# Patient Record
Sex: Female | Born: 1960 | Race: White | Hispanic: No | Marital: Married | Smoking: Current every day smoker
Health system: Southern US, Community
[De-identification: ages and names within clinical notes are randomized; demographics above are authoritative.]

## PROBLEM LIST (undated history)

## (undated) DIAGNOSIS — C801 Malignant (primary) neoplasm, unspecified: Secondary | ICD-10-CM

## (undated) DIAGNOSIS — C50919 Malignant neoplasm of unspecified site of unspecified female breast: Secondary | ICD-10-CM

---

## 2005-01-01 ENCOUNTER — Emergency Department: Payer: Self-pay | Admitting: Emergency Medicine

## 2005-02-24 ENCOUNTER — Emergency Department: Payer: Self-pay | Admitting: Emergency Medicine

## 2005-05-29 ENCOUNTER — Emergency Department: Payer: Self-pay | Admitting: Emergency Medicine

## 2006-03-06 ENCOUNTER — Ambulatory Visit: Payer: Self-pay | Admitting: Family Medicine

## 2007-01-06 ENCOUNTER — Other Ambulatory Visit: Payer: Self-pay

## 2007-01-06 ENCOUNTER — Emergency Department: Payer: Self-pay | Admitting: Emergency Medicine

## 2007-02-03 ENCOUNTER — Emergency Department: Payer: Self-pay | Admitting: Emergency Medicine

## 2007-07-29 ENCOUNTER — Other Ambulatory Visit: Payer: Self-pay

## 2007-07-29 ENCOUNTER — Emergency Department: Payer: Self-pay

## 2007-11-11 DIAGNOSIS — C801 Malignant (primary) neoplasm, unspecified: Secondary | ICD-10-CM

## 2007-11-11 DIAGNOSIS — C50919 Malignant neoplasm of unspecified site of unspecified female breast: Secondary | ICD-10-CM

## 2007-11-11 HISTORY — DX: Malignant (primary) neoplasm, unspecified: C80.1

## 2007-11-11 HISTORY — PX: MASTECTOMY: SHX3

## 2007-11-11 HISTORY — DX: Malignant neoplasm of unspecified site of unspecified female breast: C50.919

## 2007-11-17 ENCOUNTER — Ambulatory Visit: Payer: Self-pay

## 2007-12-09 ENCOUNTER — Ambulatory Visit: Payer: Self-pay | Admitting: General Surgery

## 2007-12-14 ENCOUNTER — Ambulatory Visit: Payer: Self-pay | Admitting: General Surgery

## 2007-12-28 ENCOUNTER — Ambulatory Visit: Payer: Self-pay | Admitting: General Surgery

## 2008-01-04 ENCOUNTER — Ambulatory Visit: Payer: Self-pay | Admitting: Oncology

## 2008-01-09 ENCOUNTER — Ambulatory Visit: Payer: Self-pay | Admitting: Oncology

## 2008-01-11 ENCOUNTER — Ambulatory Visit: Payer: Self-pay | Admitting: Oncology

## 2008-01-20 ENCOUNTER — Ambulatory Visit: Payer: Self-pay | Admitting: General Surgery

## 2008-01-21 ENCOUNTER — Other Ambulatory Visit: Payer: Self-pay

## 2008-02-09 ENCOUNTER — Ambulatory Visit: Payer: Self-pay | Admitting: Oncology

## 2008-03-10 ENCOUNTER — Ambulatory Visit: Payer: Self-pay | Admitting: Oncology

## 2008-03-22 ENCOUNTER — Other Ambulatory Visit: Payer: Self-pay

## 2008-04-10 ENCOUNTER — Ambulatory Visit: Payer: Self-pay | Admitting: Oncology

## 2008-05-10 ENCOUNTER — Ambulatory Visit: Payer: Self-pay | Admitting: Oncology

## 2008-06-10 ENCOUNTER — Ambulatory Visit: Payer: Self-pay | Admitting: Oncology

## 2008-06-19 ENCOUNTER — Other Ambulatory Visit: Payer: Self-pay

## 2008-07-11 ENCOUNTER — Ambulatory Visit: Payer: Self-pay | Admitting: Oncology

## 2008-08-10 ENCOUNTER — Ambulatory Visit: Payer: Self-pay | Admitting: Oncology

## 2008-09-10 ENCOUNTER — Ambulatory Visit: Payer: Self-pay | Admitting: Oncology

## 2008-11-10 ENCOUNTER — Ambulatory Visit: Payer: Self-pay | Admitting: Oncology

## 2008-11-15 ENCOUNTER — Ambulatory Visit: Payer: Self-pay | Admitting: General Surgery

## 2008-11-15 ENCOUNTER — Encounter: Payer: Self-pay | Admitting: Neurology

## 2008-12-04 ENCOUNTER — Ambulatory Visit: Payer: Self-pay | Admitting: Oncology

## 2008-12-11 ENCOUNTER — Ambulatory Visit: Payer: Self-pay | Admitting: Oncology

## 2008-12-11 ENCOUNTER — Encounter: Payer: Self-pay | Admitting: Neurology

## 2009-02-08 ENCOUNTER — Ambulatory Visit: Payer: Self-pay | Admitting: Oncology

## 2009-03-05 ENCOUNTER — Ambulatory Visit: Payer: Self-pay | Admitting: Oncology

## 2009-03-10 ENCOUNTER — Ambulatory Visit: Payer: Self-pay | Admitting: Oncology

## 2009-05-10 ENCOUNTER — Ambulatory Visit: Payer: Self-pay | Admitting: Oncology

## 2009-06-04 ENCOUNTER — Ambulatory Visit: Payer: Self-pay | Admitting: Oncology

## 2009-06-10 ENCOUNTER — Ambulatory Visit: Payer: Self-pay | Admitting: Oncology

## 2009-06-29 IMAGING — US ULTRASOUND RIGHT BREAST
1 series · 16 of 25 positions shown · non-contrast
Comparison: none

REASON FOR EXAM: right breast mass
COMMENTS:

[Series 1: ultrasound right breast · 16 of 43 slices shown]
[im 1/43]
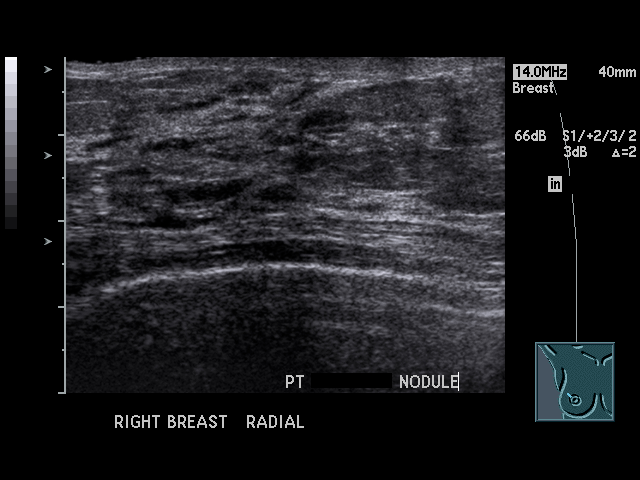
[im 4/43]
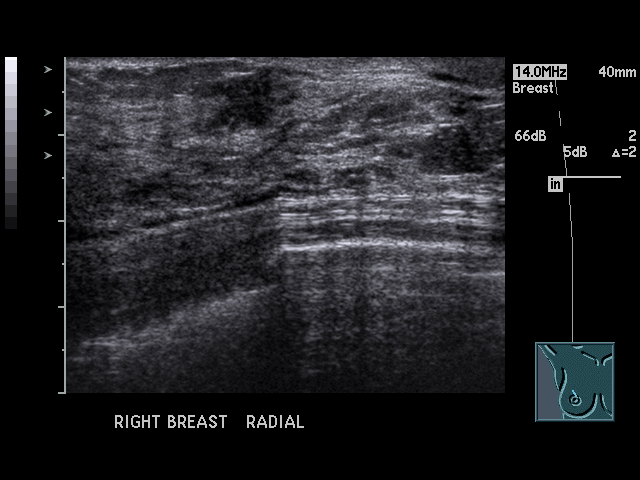
[im 6/43]
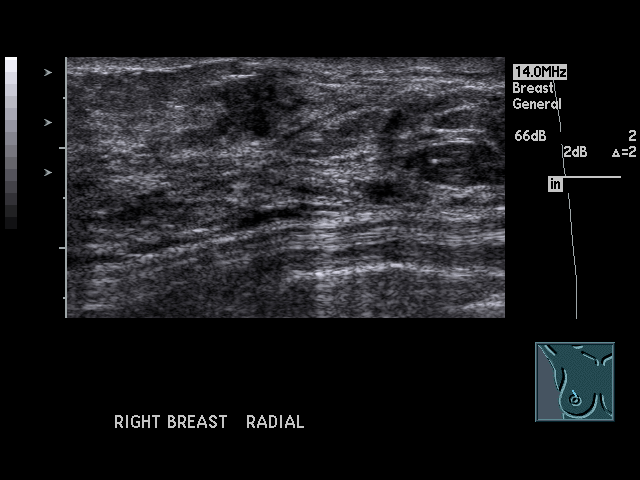
[im 9/43]
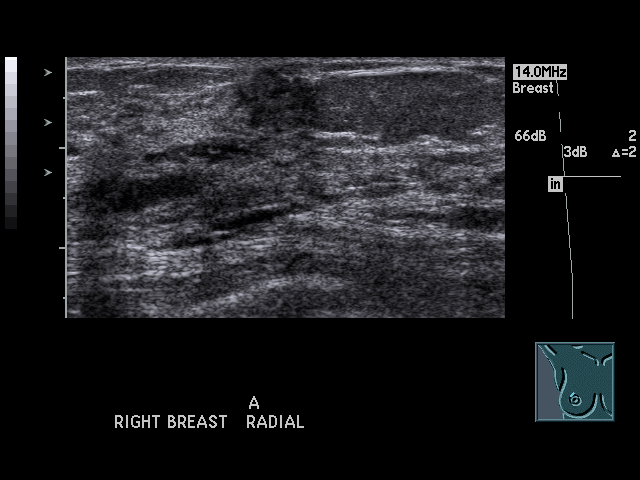
[im 13/43]
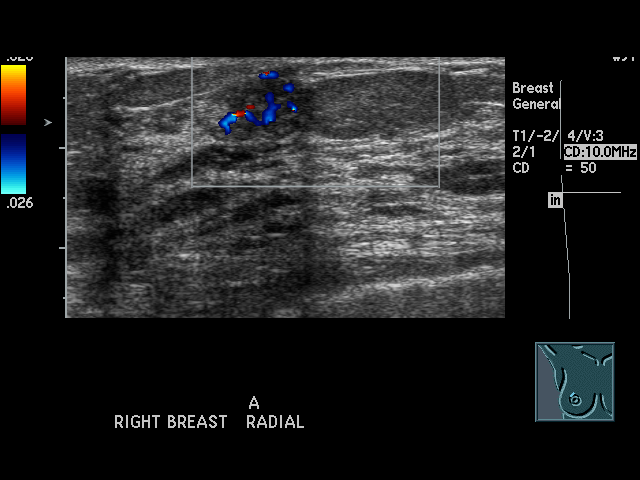
[im 15/43]
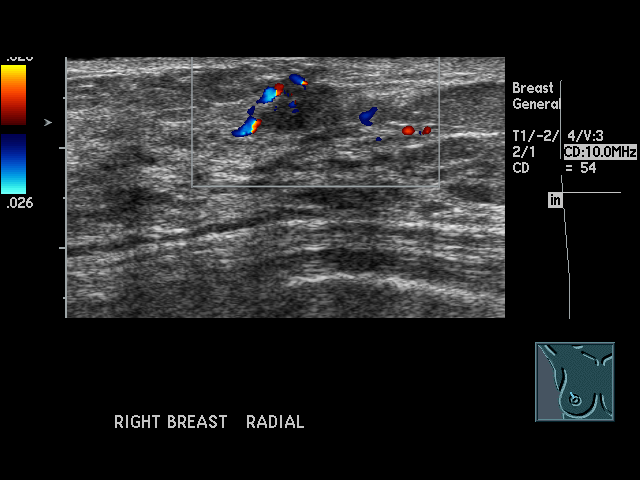
[im 18/43]
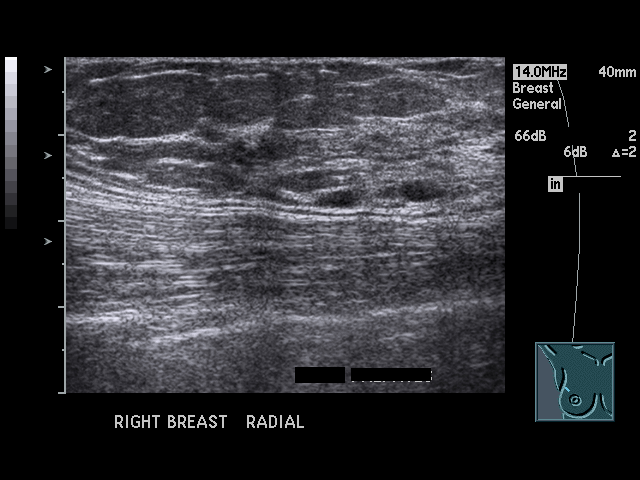
[im 20/43]
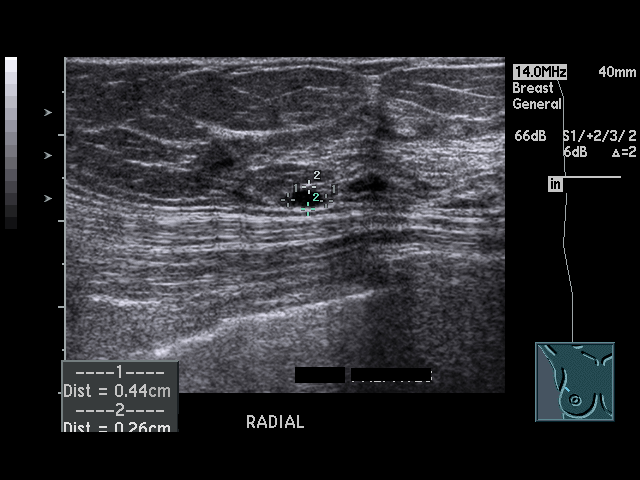
[im 23/43]
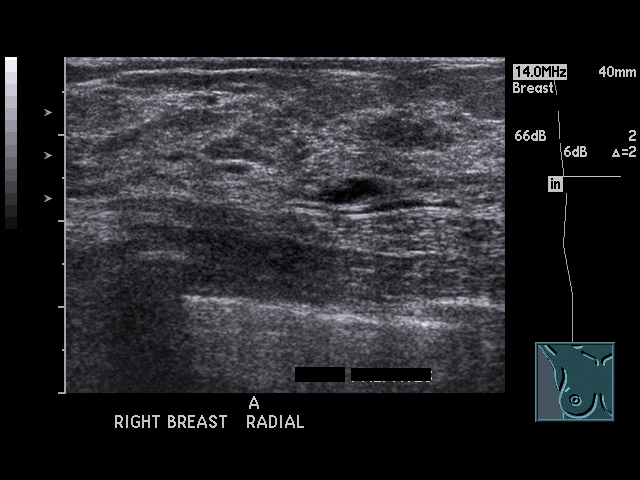
[im 25/43]
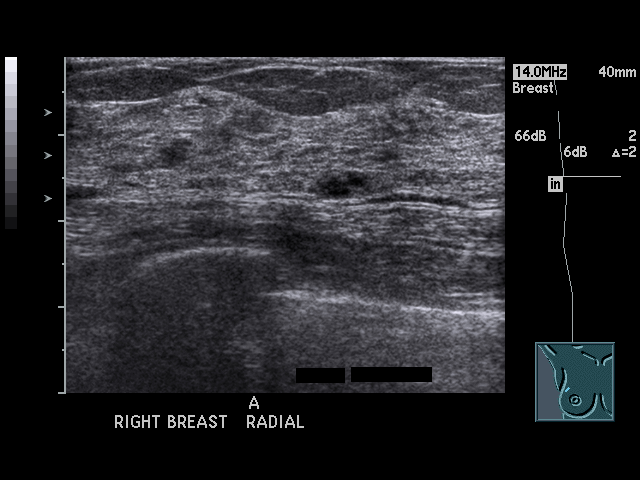
[im 29/43]
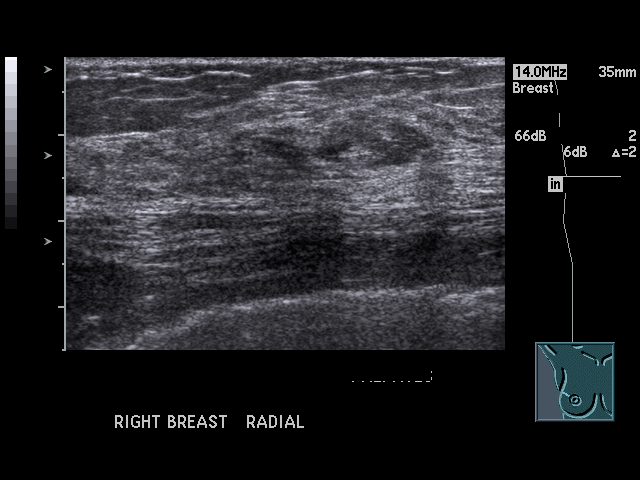
[im 30/43]
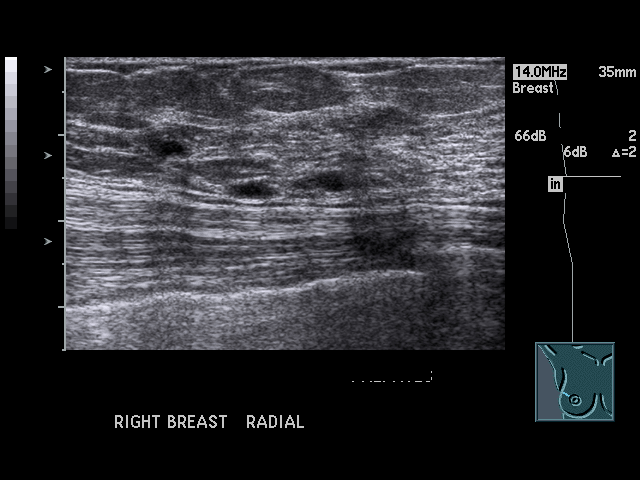
[im 34/43]
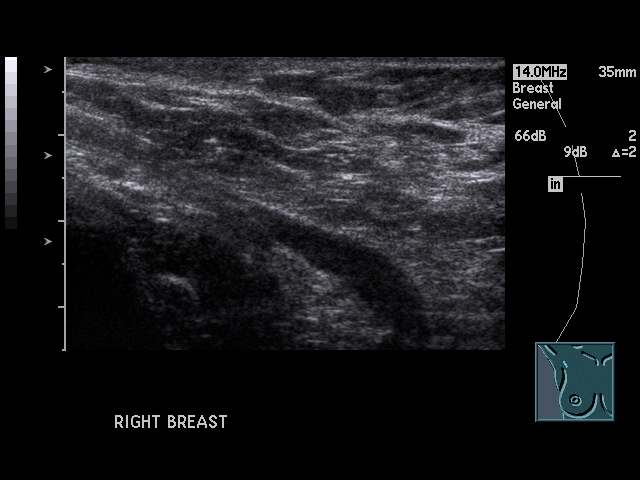
[im 37/43]
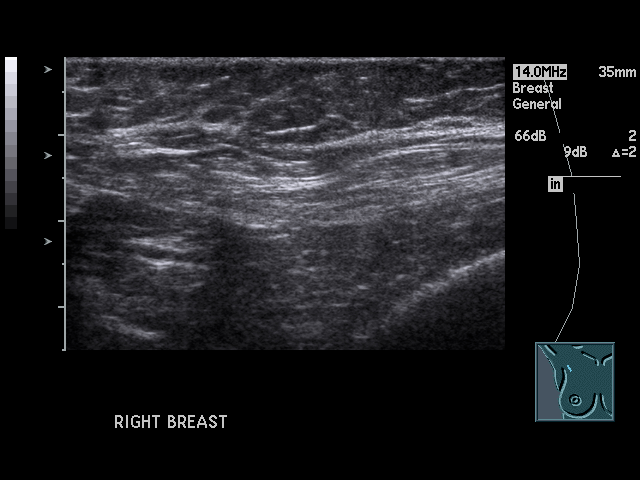
[im 39/43]
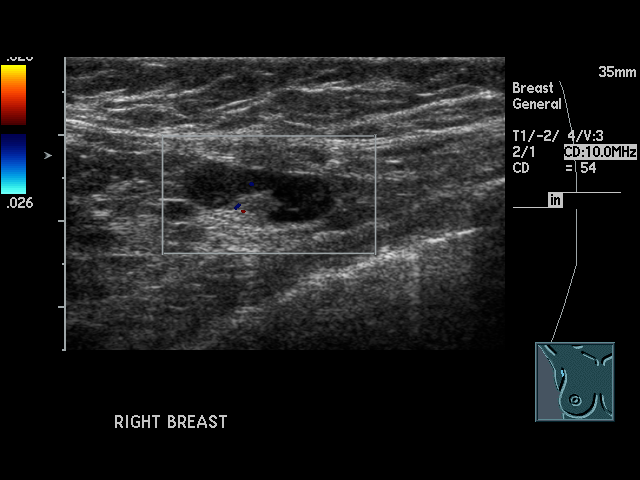
[im 43/43]
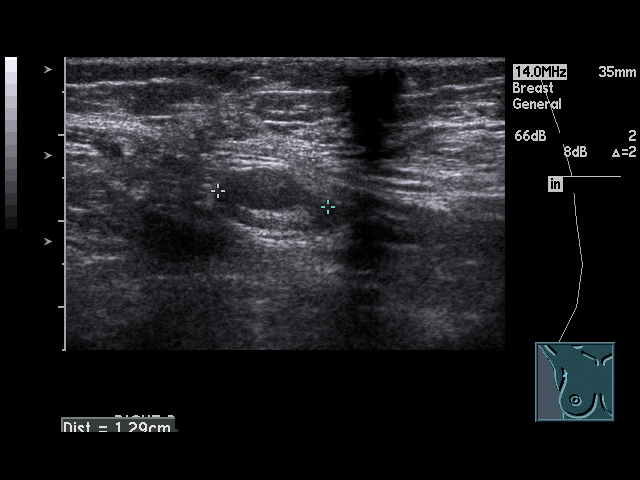

[16 of 25 positions shown; findings below may reference images not displayed]

PROCEDURE:     US  - US RT BREAST ([REDACTED])  - November 17, 2007 [DATE]

RESULT:

Comparison is made to images from [REDACTED] dated 05/01/06.  There
are no prior exams otherwise for comparison.  The patient reports a 10 to 11
o'clock periareolar nodular density. There are also suspicious areas in the
11 o'clock axillary region and the high axilla.  These areas are marked with
triangle outline markers. The breasts exhibit a moderately dense nodular
fibroglandular pattern. No definite persistent mammographic areas of density
are seen in the areas of concern. The parenchymal distribution and relative
density appears to be unchanged compared to the previous outside exams.
Additional magnification compression views over the areas of palpable
abnormality are obtained.  There may be some underlying density in the 11
o'clock axillary region and in the periareolar region but these are poorly
defined. Ultrasound is dictated separately.
IMPRESSION: 1.     BI-RADS:  Category 0-Needs Additional Imaging Evaluation.
2.     Ultrasound correlation of the areas of concern is necessary.
Ultrasound was subsequently performed.  In the 11 o'clock periareolar region
there is a 7.5 x 6.3 x 8.5 mm irregularly marginated solid-appearing
structure with evidence of flow. This is concerning and further
investigation surgically with consideration for biopsy is suggested. The
area in the axilla appears to have a fatty hilum and is likely a lymph node.
The third area of concern in the 11 o'clock axillary region appears to be
small cystic areas but they do not show definite enhancement. These are near
the chest wall.  These are nonspecific.  Given the ultrasound findings, the
exam is BI-RADS:  Category 4- Suspicious Abnormality.  Surgical consultation
with consideration for biopsy is recommended.

## 2009-07-11 ENCOUNTER — Ambulatory Visit: Payer: Self-pay | Admitting: Oncology

## 2009-09-03 ENCOUNTER — Ambulatory Visit: Payer: Self-pay | Admitting: Oncology

## 2009-09-10 ENCOUNTER — Ambulatory Visit: Payer: Self-pay | Admitting: Oncology

## 2009-09-13 ENCOUNTER — Ambulatory Visit: Payer: Self-pay | Admitting: Oncology

## 2009-10-10 ENCOUNTER — Ambulatory Visit: Payer: Self-pay | Admitting: Oncology

## 2009-10-11 ENCOUNTER — Ambulatory Visit: Payer: Self-pay | Admitting: Oncology

## 2009-11-10 ENCOUNTER — Ambulatory Visit: Payer: Self-pay | Admitting: Oncology

## 2009-12-18 ENCOUNTER — Ambulatory Visit: Payer: Self-pay | Admitting: Oncology

## 2010-06-10 ENCOUNTER — Ambulatory Visit: Payer: Self-pay | Admitting: Oncology

## 2010-06-19 ENCOUNTER — Ambulatory Visit: Payer: Self-pay | Admitting: Oncology

## 2010-06-20 LAB — CANCER ANTIGEN 27.29: CA 27.29: 19.6 U/mL (ref 0.0–38.6)

## 2010-07-11 ENCOUNTER — Ambulatory Visit: Payer: Self-pay | Admitting: Oncology

## 2010-12-19 ENCOUNTER — Ambulatory Visit: Payer: Self-pay | Admitting: Oncology

## 2011-03-31 ENCOUNTER — Emergency Department: Payer: Self-pay | Admitting: Emergency Medicine

## 2011-04-21 ENCOUNTER — Ambulatory Visit: Payer: Self-pay | Admitting: Oncology

## 2011-04-22 LAB — CANCER ANTIGEN 27.29: CA 27.29: 17.4 U/mL (ref 0.0–38.6)

## 2011-05-11 ENCOUNTER — Ambulatory Visit: Payer: Self-pay | Admitting: Oncology

## 2011-10-20 ENCOUNTER — Ambulatory Visit: Payer: Self-pay | Admitting: Oncology

## 2011-10-22 ENCOUNTER — Emergency Department: Payer: Self-pay | Admitting: Unknown Physician Specialty

## 2011-11-11 ENCOUNTER — Ambulatory Visit: Payer: Self-pay | Admitting: Oncology

## 2011-12-16 ENCOUNTER — Emergency Department: Payer: Self-pay | Admitting: Emergency Medicine

## 2011-12-16 LAB — CBC
HCT: 39.5 % (ref 35.0–47.0)
MCH: 32.4 pg (ref 26.0–34.0)
MCV: 96 fL (ref 80–100)
Platelet: 218 10*3/uL (ref 150–440)
RBC: 4.12 10*6/uL (ref 3.80–5.20)
RDW: 14.7 % — ABNORMAL HIGH (ref 11.5–14.5)
WBC: 7.6 10*3/uL (ref 3.6–11.0)

## 2011-12-16 LAB — BASIC METABOLIC PANEL
Anion Gap: 13 (ref 7–16)
BUN: 14 mg/dL (ref 7–18)
Calcium, Total: 9 mg/dL (ref 8.5–10.1)
Chloride: 106 mmol/L (ref 98–107)
Co2: 24 mmol/L (ref 21–32)
EGFR (African American): >60
EGFR (Non-African Amer.): >60
Osmolality: 285 (ref 275–301)

## 2012-01-04 ENCOUNTER — Ambulatory Visit: Payer: Self-pay | Admitting: Family Medicine

## 2012-06-07 ENCOUNTER — Ambulatory Visit: Payer: Self-pay | Admitting: Oncology

## 2012-06-09 ENCOUNTER — Ambulatory Visit: Payer: Self-pay | Admitting: Oncology

## 2012-06-10 ENCOUNTER — Ambulatory Visit: Payer: Self-pay | Admitting: Oncology

## 2012-06-10 LAB — CANCER ANTIGEN 27.29: CA 27.29: 10.9 U/mL (ref 0.0–38.6)

## 2012-07-11 ENCOUNTER — Ambulatory Visit: Payer: Self-pay | Admitting: Oncology

## 2012-12-11 ENCOUNTER — Ambulatory Visit: Payer: Self-pay | Admitting: Oncology

## 2013-03-09 ENCOUNTER — Emergency Department: Payer: Self-pay | Admitting: Internal Medicine

## 2013-03-09 LAB — CBC
HCT: 44.2 % (ref 35.0–47.0)
HGB: 15.3 g/dL (ref 12.0–16.0)
MCH: 33.1 pg (ref 26.0–34.0)
MCV: 96 fL (ref 80–100)
Platelet: 191 10*3/uL (ref 150–440)
RDW: 13.8 % (ref 11.5–14.5)
WBC: 5.8 10*3/uL (ref 3.6–11.0)

## 2013-03-09 LAB — COMPREHENSIVE METABOLIC PANEL
Albumin: 4 g/dL (ref 3.4–5.0)
Alkaline Phosphatase: 94 U/L (ref 50–136)
Anion Gap: 6 — ABNORMAL LOW (ref 7–16)
BUN: 15 mg/dL (ref 7–18)
Calcium, Total: 9 mg/dL (ref 8.5–10.1)
Creatinine: 0.75 mg/dL (ref 0.60–1.30)
Osmolality: 280 (ref 275–301)
Potassium: 4.1 mmol/L (ref 3.5–5.1)
SGOT(AST): 20 U/L (ref 15–37)
SGPT (ALT): 16 U/L (ref 12–78)
Total Protein: 6.9 g/dL (ref 6.4–8.2)

## 2013-03-11 ENCOUNTER — Emergency Department: Payer: Self-pay | Admitting: Internal Medicine

## 2013-03-11 LAB — COMPREHENSIVE METABOLIC PANEL
Albumin: 4 g/dL (ref 3.4–5.0)
Anion Gap: 6 — ABNORMAL LOW (ref 7–16)
Calcium, Total: 9.1 mg/dL (ref 8.5–10.1)
Co2: 24 mmol/L (ref 21–32)
Glucose: 150 mg/dL — ABNORMAL HIGH (ref 65–99)
Osmolality: 286 (ref 275–301)
Potassium: 3.8 mmol/L (ref 3.5–5.1)
SGOT(AST): 20 U/L (ref 15–37)
Sodium: 139 mmol/L (ref 136–145)
Total Protein: 6.8 g/dL (ref 6.4–8.2)

## 2013-03-11 LAB — URINALYSIS, COMPLETE
Bilirubin,UR: NEGATIVE
Glucose,UR: NEGATIVE mg/dL (ref 0–75)
Ph: 5 (ref 4.5–8.0)
Protein: NEGATIVE
RBC,UR: 1 /HPF (ref 0–5)
Squamous Epithelial: 6
WBC UR: 1 /HPF (ref 0–5)

## 2013-03-11 LAB — CBC
HGB: 15.5 g/dL (ref 12.0–16.0)
MCHC: 34.6 g/dL (ref 32.0–36.0)
Platelet: 185 10*3/uL (ref 150–440)
WBC: 7.3 10*3/uL (ref 3.6–11.0)

## 2013-03-11 LAB — LIPASE, BLOOD: Lipase: 113 U/L (ref 73–393)

## 2013-03-11 LAB — GC/CHLAMYDIA PROBE AMP

## 2013-04-21 ENCOUNTER — Ambulatory Visit: Payer: Self-pay | Admitting: Oncology

## 2013-04-23 LAB — CANCER ANTIGEN 27.29: CA 27.29: 20.7 U/mL (ref 0.0–38.6)

## 2013-05-10 ENCOUNTER — Ambulatory Visit: Payer: Self-pay | Admitting: Oncology

## 2013-06-10 ENCOUNTER — Ambulatory Visit: Payer: Self-pay | Admitting: Oncology

## 2013-06-13 ENCOUNTER — Ambulatory Visit: Payer: Self-pay | Admitting: Oncology

## 2013-06-20 LAB — CANCER ANTIGEN 27.29: CA 27.29: 11.8 U/mL (ref 0.0–38.6)

## 2013-07-07 ENCOUNTER — Emergency Department: Payer: Self-pay | Admitting: Emergency Medicine

## 2013-07-07 LAB — CBC
HGB: 14.4 g/dL (ref 12.0–16.0)
MCV: 100 fL (ref 80–100)

## 2013-07-07 LAB — BASIC METABOLIC PANEL
Anion Gap: 7 (ref 7–16)
Chloride: 106 mmol/L (ref 98–107)
EGFR (African American): >60
EGFR (Non-African Amer.): >60
Glucose: 107 mg/dL — ABNORMAL HIGH (ref 65–99)
Osmolality: 275 (ref 275–301)
Potassium: 3.3 mmol/L — ABNORMAL LOW (ref 3.5–5.1)
Sodium: 137 mmol/L (ref 136–145)

## 2013-07-07 LAB — TROPONIN I: Troponin-I: <0.02 ng/mL

## 2013-07-07 LAB — ETHANOL: Ethanol: 50 mg/dL

## 2013-07-11 ENCOUNTER — Ambulatory Visit: Payer: Self-pay | Admitting: Oncology

## 2013-07-28 IMAGING — CR DG CHEST 2V
1 series · 2 of 2 positions shown · non-contrast
Comparison: none

REASON FOR EXAM: cp
COMMENTS:

[Series 1: w chest pa · 0.14mm/px · 2 of 2 slices shown]
[im 1/2]
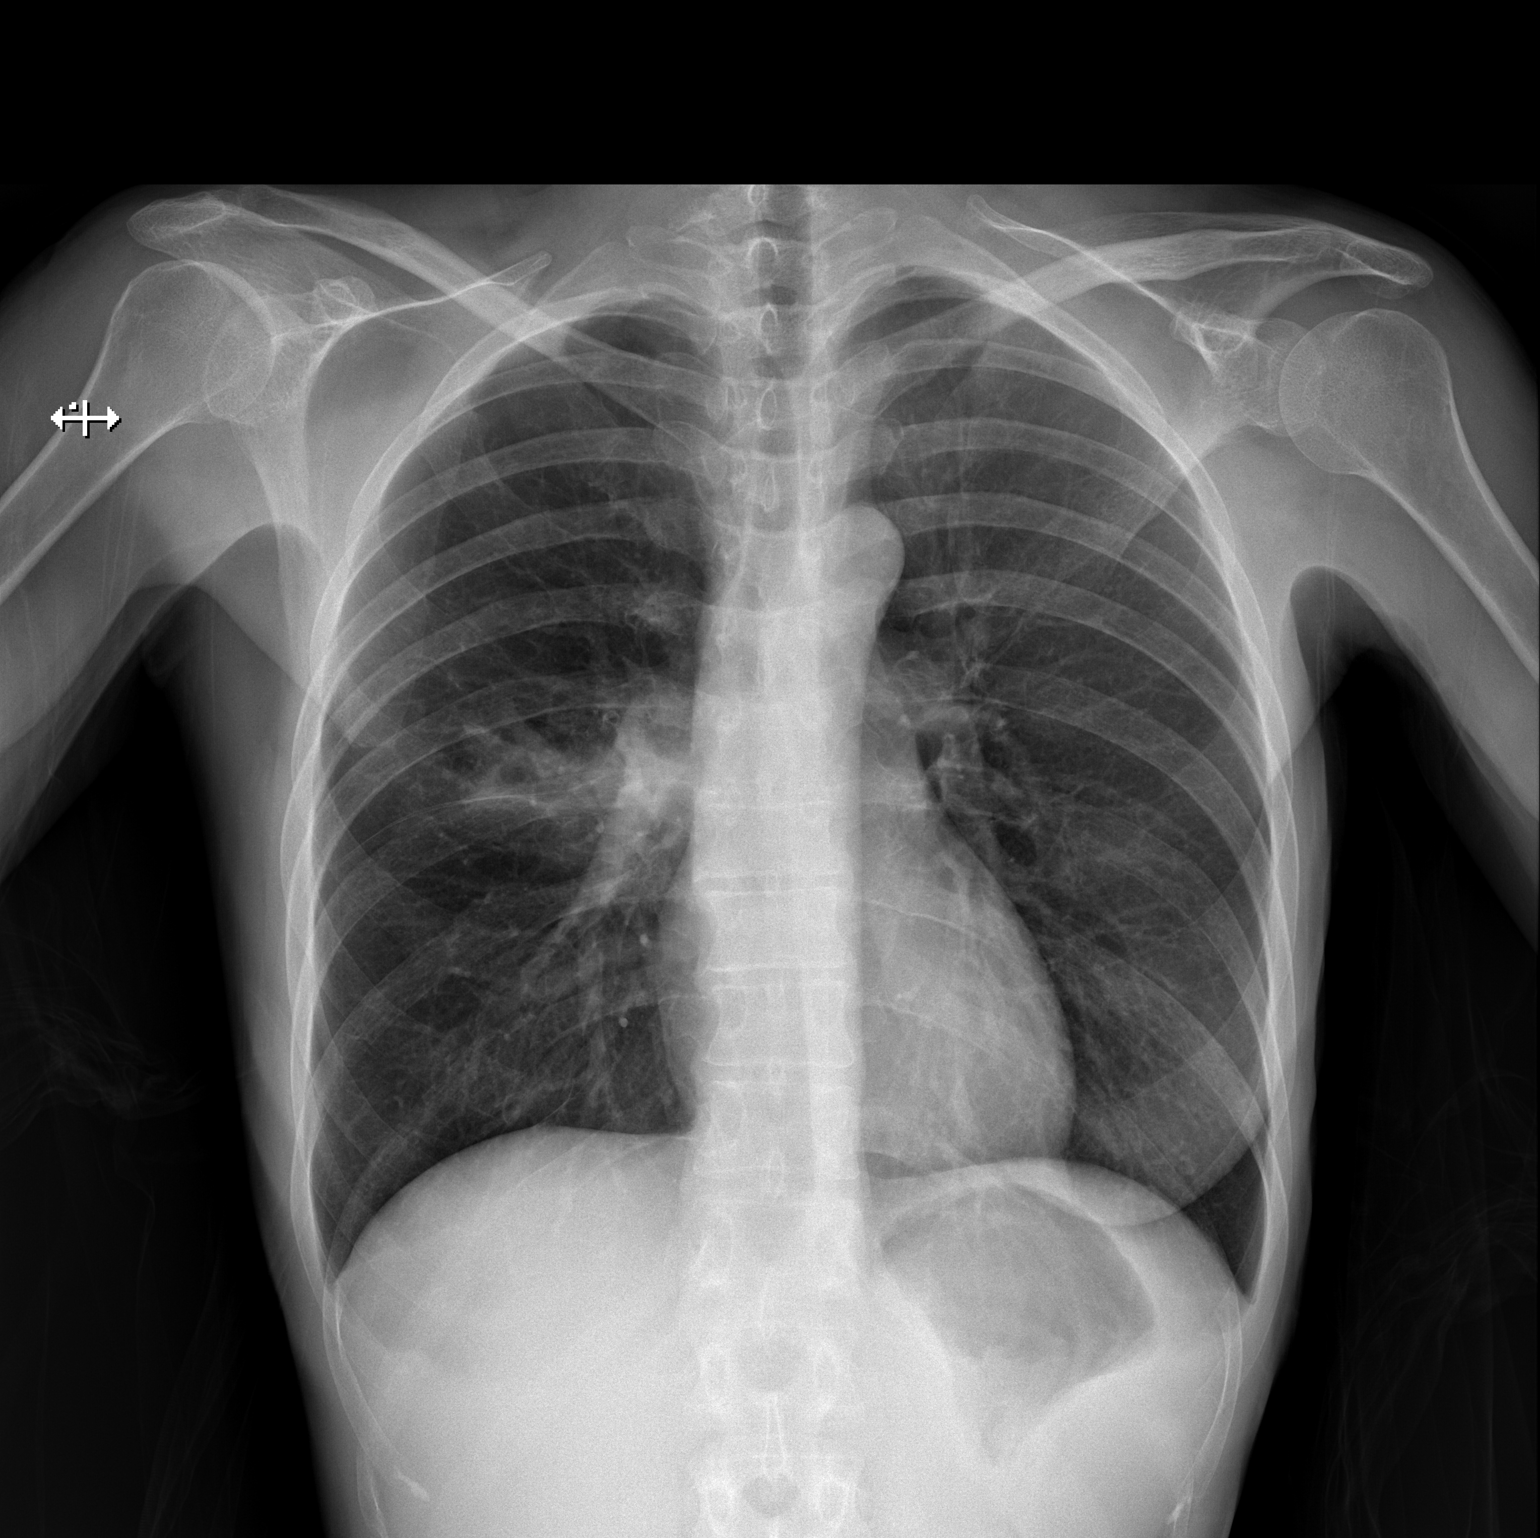
[im 2/2]
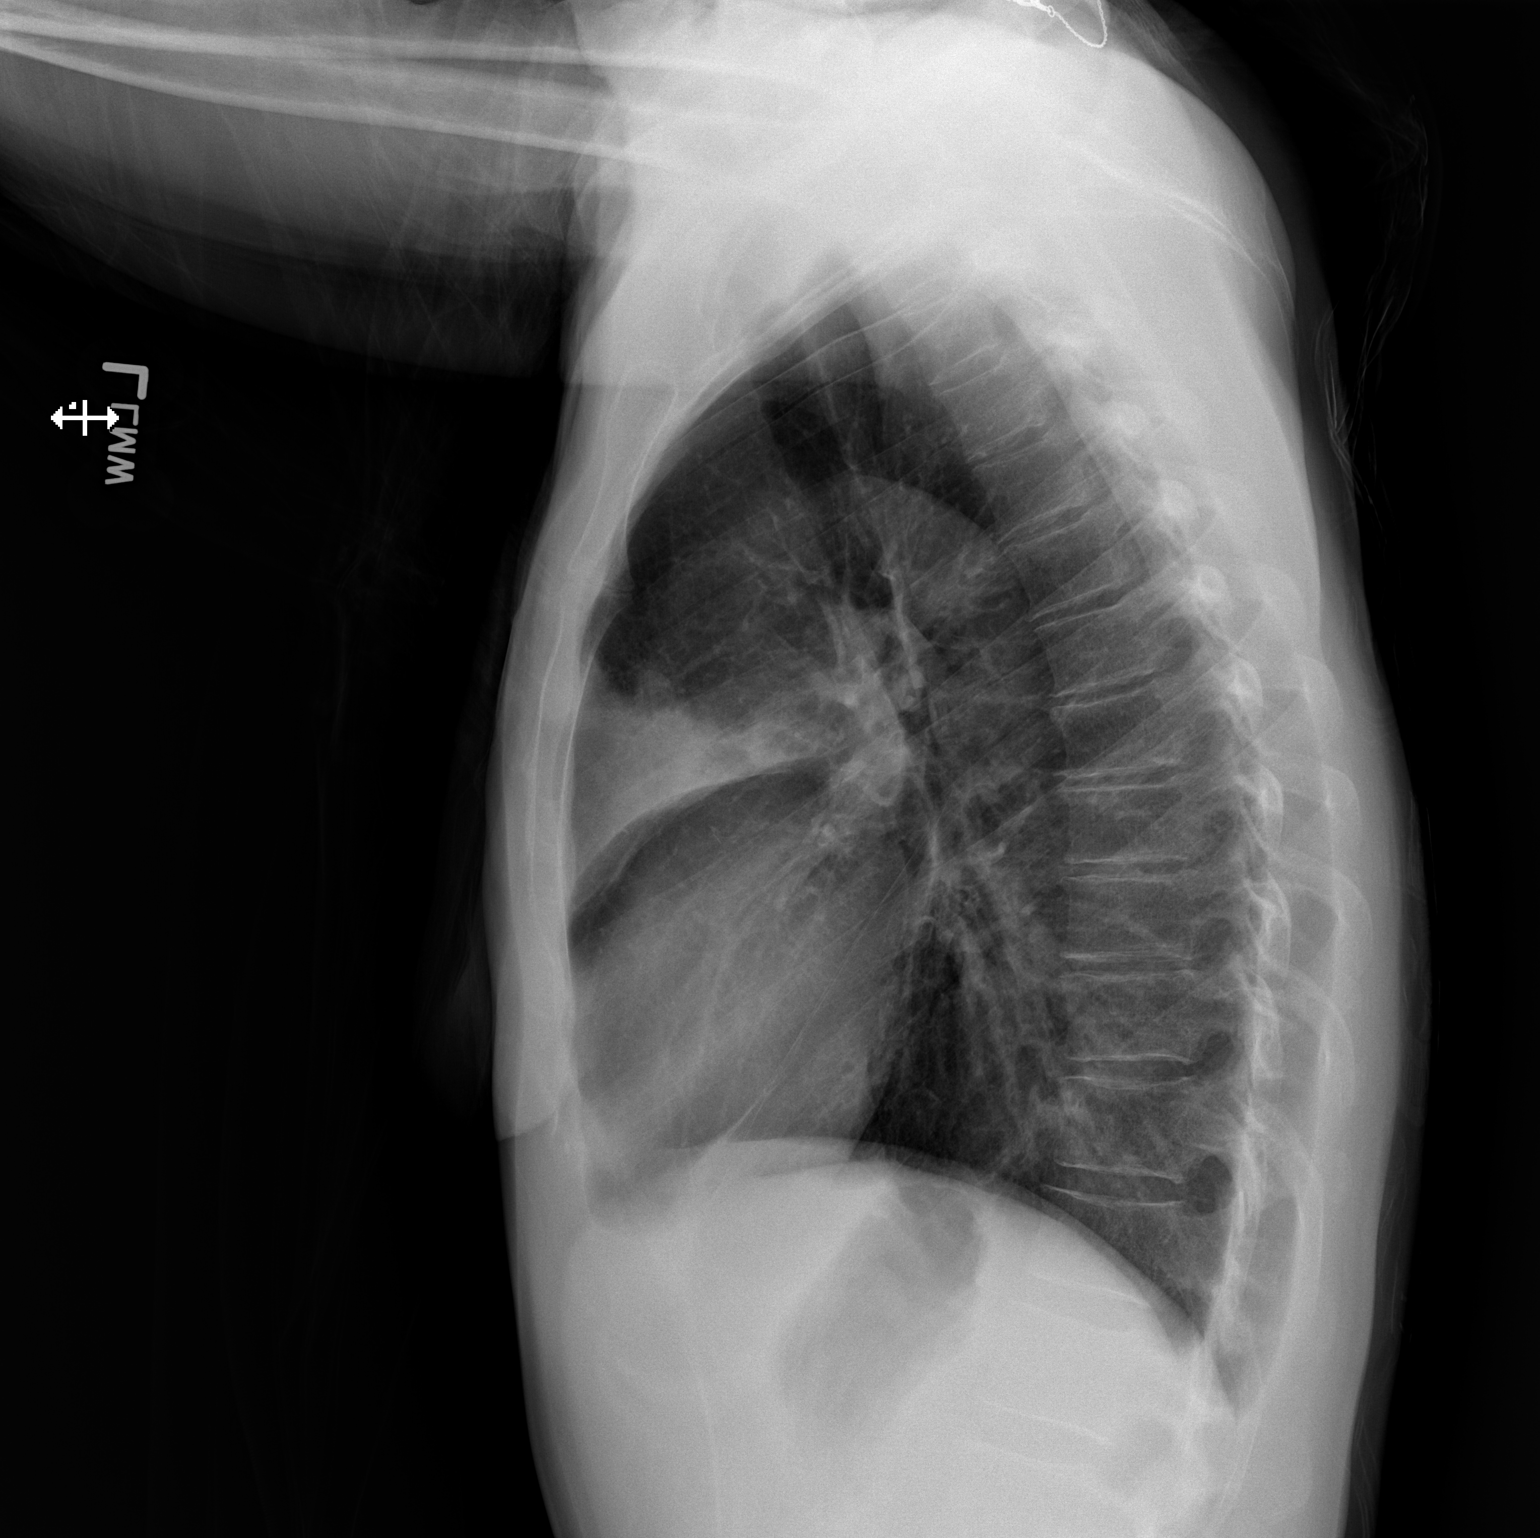

[2 of 2 positions shown; findings below may reference images not displayed]

PROCEDURE:     DXR - DXR CHEST PA (OR AP) AND LATERAL  - December 16, 2011  [DATE]

RESULT:     Comparison is made to the prior exam of 01/20/2008. The current
exam shows consolidation involving the anterior segment of the right upper
lobe. Pneumonia would be the first consideration. Atelectasis associated
with Dario Jimmy stenosis cannot be entirely excluded at this point and followup
examination until clear is recommended. CT may be needed if the
consolidation does not clear with treatment. The left lung field is clear.
Heart size is normal. The mediastinal and osseous structures show no
significant abnormalities.
IMPRESSION: There is observed consolidation in the anterior segment of the right upper
lobe. Pneumonia would be the first consideration. Atelectasis would also be
a possibility in the differential. Additionally, since in the lateral view
the finding is pleural-based the possibility of pulmonary embolus cannot be
ruled out. Followup examination until clear or additional evaluation by
chest CT is recommended.

## 2013-10-12 ENCOUNTER — Emergency Department: Payer: Self-pay | Admitting: Internal Medicine

## 2014-07-06 ENCOUNTER — Ambulatory Visit: Payer: Self-pay

## 2014-07-07 ENCOUNTER — Ambulatory Visit: Payer: Self-pay

## 2014-07-10 ENCOUNTER — Ambulatory Visit: Payer: Self-pay

## 2014-07-10 ENCOUNTER — Ambulatory Visit: Payer: Self-pay | Admitting: Primary Care

## 2014-07-18 ENCOUNTER — Ambulatory Visit: Payer: Self-pay | Admitting: Oncology

## 2014-08-10 ENCOUNTER — Ambulatory Visit: Payer: Self-pay | Admitting: Oncology

## 2014-08-10 ENCOUNTER — Ambulatory Visit: Payer: Self-pay | Admitting: Family Medicine

## 2015-02-23 ENCOUNTER — Other Ambulatory Visit: Payer: Self-pay | Admitting: Oncology

## 2015-02-23 DIAGNOSIS — Z853 Personal history of malignant neoplasm of breast: Secondary | ICD-10-CM

## 2015-04-23 ENCOUNTER — Telehealth: Payer: Self-pay | Admitting: *Deleted

## 2015-04-23 NOTE — Telephone Encounter (Signed)
PC to patient and advised to go to Urgent care or ER she said "Alrighty"

## 2015-04-23 NOTE — Telephone Encounter (Signed)
She need to go to urgent care or the ER. Thanks.

## 2015-07-13 ENCOUNTER — Other Ambulatory Visit: Payer: Self-pay

## 2015-07-17 ENCOUNTER — Inpatient Hospital Stay: Payer: Self-pay | Attending: Oncology | Admitting: Oncology

## 2015-07-31 ENCOUNTER — Telehealth: Payer: Self-pay | Admitting: Oncology

## 2015-07-31 NOTE — Telephone Encounter (Signed)
Patient called to rs her missed MD and mammo/US of brst appts.  When I called [facility] they said she only needs a uni left screening. Can you please change the orders so we can get it scheduled? She indicated her other doctor wants her to come in and get checked by Woodfin Ganja bc she has lost 10 lbs in two weeks. Thanks.

## 2015-08-01 ENCOUNTER — Other Ambulatory Visit: Payer: Self-pay | Admitting: Oncology

## 2015-08-01 DIAGNOSIS — C50911 Malignant neoplasm of unspecified site of right female breast: Secondary | ICD-10-CM

## 2015-08-01 NOTE — Telephone Encounter (Signed)
The order in Epic is a uni left... But it is diagnostic and includes an ultrasound. She only needs a uni left screening and so we need a new order.

## 2015-08-01 NOTE — Telephone Encounter (Signed)
Dr. Grayland Ormond, on 07/18/2014 you entered an order for diagnostic uni left mammogram with u/s which is ordered in [place].  [facility] Poli is requesting the order be changed to screening.  Could you please change?

## 2015-08-01 NOTE — Telephone Encounter (Signed)
The order in Epic is ordered as uni left.

## 2015-08-01 NOTE — Telephone Encounter (Signed)
Order entered

## 2015-08-02 ENCOUNTER — Other Ambulatory Visit: Payer: Self-pay | Admitting: Oncology

## 2015-08-02 DIAGNOSIS — Z1231 Encounter for screening mammogram for malignant neoplasm of breast: Secondary | ICD-10-CM

## 2015-08-15 ENCOUNTER — Ambulatory Visit: Payer: Medicare Other | Admitting: Oncology

## 2015-08-15 ENCOUNTER — Ambulatory Visit: Payer: Medicare Other

## 2015-08-29 ENCOUNTER — Ambulatory Visit: Payer: Medicare Other

## 2015-08-29 ENCOUNTER — Inpatient Hospital Stay: Payer: Medicare Other | Admitting: Oncology

## 2015-09-13 ENCOUNTER — Ambulatory Visit: Payer: Medicare Other

## 2015-09-26 ENCOUNTER — Other Ambulatory Visit: Payer: Self-pay | Admitting: Oncology

## 2015-09-26 ENCOUNTER — Inpatient Hospital Stay: Payer: Self-pay | Admitting: Oncology

## 2015-09-26 ENCOUNTER — Ambulatory Visit
Admission: RE | Admit: 2015-09-26 | Discharge: 2015-09-26 | Disposition: A | Discharge status: Home / Self Care | Payer: Medicare HMO | Source: Ambulatory Visit | Attending: Oncology | Admitting: Oncology

## 2015-09-26 DIAGNOSIS — Z1231 Encounter for screening mammogram for malignant neoplasm of breast: Secondary | ICD-10-CM | POA: Insufficient documentation

## 2015-09-26 DIAGNOSIS — Z9011 Acquired absence of right breast and nipple: Secondary | ICD-10-CM | POA: Insufficient documentation

## 2015-09-26 HISTORY — DX: Malignant neoplasm of unspecified site of unspecified female breast: C50.919

## 2015-09-26 HISTORY — DX: Malignant (primary) neoplasm, unspecified: C80.1

## 2015-09-28 ENCOUNTER — Ambulatory Visit: Payer: Self-pay | Admitting: Oncology

## 2015-10-19 ENCOUNTER — Inpatient Hospital Stay: Payer: Self-pay | Admitting: Oncology

## 2015-11-30 ENCOUNTER — Ambulatory Visit: Payer: Self-pay | Admitting: Oncology

## 2015-12-14 ENCOUNTER — Inpatient Hospital Stay: Payer: Medicare HMO | Attending: Oncology | Admitting: Oncology

## 2015-12-14 VITALS — BP 120/76 | HR 91 | Temp 98.3°F | Resp 18 | Wt 129.4 lb

## 2015-12-14 DIAGNOSIS — F419 Anxiety disorder, unspecified: Secondary | ICD-10-CM | POA: Diagnosis not present

## 2015-12-14 DIAGNOSIS — Z853 Personal history of malignant neoplasm of breast: Secondary | ICD-10-CM | POA: Diagnosis present

## 2015-12-14 DIAGNOSIS — Z9011 Acquired absence of right breast and nipple: Secondary | ICD-10-CM | POA: Diagnosis not present

## 2015-12-14 DIAGNOSIS — L659 Nonscarring hair loss, unspecified: Secondary | ICD-10-CM | POA: Diagnosis not present

## 2015-12-14 DIAGNOSIS — Z79899 Other long term (current) drug therapy: Secondary | ICD-10-CM | POA: Diagnosis not present

## 2015-12-14 DIAGNOSIS — Z170 Estrogen receptor positive status [ER+]: Secondary | ICD-10-CM | POA: Diagnosis not present

## 2015-12-14 DIAGNOSIS — M549 Dorsalgia, unspecified: Secondary | ICD-10-CM | POA: Diagnosis not present

## 2015-12-14 DIAGNOSIS — C50911 Malignant neoplasm of unspecified site of right female breast: Secondary | ICD-10-CM
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
## 2017-05-07 ENCOUNTER — Encounter: Payer: Self-pay | Admitting: Oncology

## 2019-03-22 DIAGNOSIS — Z853 Personal history of malignant neoplasm of breast: Secondary | ICD-10-CM

## 2019-03-22 NOTE — Research (Signed)
03/22/2019:  Patient is due for her 132 month follow up for the E5103 study (A Double-Blind Phase III Trial of Doxorubicin and Cyclophosphamide followed by Paclitaxel with Bevacizumab or Placebo in Patients with Lymph Node Positive and High Risk Lymph Node Negative Breast Cancer). Patient's last follow up was completed 03/15/2018.  I reviewed her medical record and patient has no contacts/visits on record during the past year.  I called phone numbers listed in [place] note to attempt to reach patient to no avail. 03/16/2019-Called [phone] (twice) and call unable to connect.  03/18/2019-Called [phone] call went to voice mail with greeting in [place].  Called [phone] and phone is out of service.  Called [phone] and voice mail states the phone belongs to [place] with [employer].  03/22/2019-Called [phone] and voice mail says "this is Larkin Ina".  Called [phone] and man answered and said he had the phone number for 4 years and did not know Elberta Fortis.  Called [phone] and call went directly to voice mail and message said voice mail not set up.  On 03/16/2019, I contacted [facility] to learn if patient has an office note that we could use as a point of contact and I was informed she has not been seen there since 2016.  I also noticed that patient has also been seen at [facility] in [place].  I left a message at [phone] with medical records clerk to see if they have an office note in the past year they could share.  I am awaiting return phone call.   04/05/2019:  I called [facility] [employer] in [place] and spoke with Ulis Rias in medical records and learned they do serve as patient's primary care physician.  I was able to obtain last office note dated 03/03/2019.  Follow up data completed for research study [phone].  Copy of office note from [facility] sent to be scanned into patient Epic record.

## 2020-02-29 DIAGNOSIS — Z853 Personal history of malignant neoplasm of breast: Secondary | ICD-10-CM

## 2020-02-29 NOTE — Research (Signed)
Patient's annual follow up for research study E5103 is coming due 03/16/2020.  In preparation for annual follow up requirements, I reviewed patient's Epic record and she has not been seen in the past year by a [facility] facility. She is receiving primary care at [facility] in [place].  I contacted [facility] [phone]) and spoke with Ulis Rias in medical records.  She stated she could not release the latest office note without a medical release form signed. I explained to her that the reason for the request is for annual survival follow up for research trial enrollment and the patient signed a release form upon consent to the trial (01/18/2008). Ulis Rias states the release form is too old.  I attempted to contact the patient at listed phone number of [phone] and I received a message that call cannot be completed (attempt made x 3).  I have composed a letter to be mailed to patient with release of information form and asked her to sign and mail back to me. I have also included my phone number with a request for patient to call me so I can update her contact information.  Will await call or return of release form. [place] Associate 02/29/2020 09:46 AM   I attempted to reach patient again at [phone] and again received message that my call could not be completed at this time.  I have not heard back from patient since mailing letter with medical release form and including my phone number. I also reviewed EMR to assure patient had no visits within a [facility] facility that could be reviewed to complete study follow-up. No records available. [facility] Associate 03/08/2020 10:20 AM

## 2020-04-05 ENCOUNTER — Encounter: Payer: Self-pay | Admitting: *Deleted

## 2020-04-05 NOTE — Research (Unsigned)
Unable to reach patient by any of the phone numbers in this medical record. Current provider declined to send patient medical records without updated release of information being signed. ROI was mailed to patient by Lula Olszewski on 03/08/20 and so far, patient has not responded. No upcoming appointments or documentation found in Care everywhere. At this point, the patient is lost to follow up for her 144 month follow up visit on the ECOG E5103 study. Will report this information to the study. Yolande Jolly, BSN, MHA, OCN 04/05/2020 9:39 AM

## 2021-04-16 ENCOUNTER — Encounter: Payer: Self-pay | Admitting: Oncology

## 2021-04-16 DIAGNOSIS — Z853 Personal history of malignant neoplasm of breast: Secondary | ICD-10-CM

## 2021-04-16 NOTE — Research (Signed)
Late entry for E5103 follow-up data -  I entered patient's data into the research data base by using the 06/25/20 note from [place].  Could not print out this note but I used it as contact for this patient for the study.  This was for her March 2022 f/u data. Next f/u due in March 2023. [place] Specialist 04/16/21

## 2024-07-01 ENCOUNTER — Telehealth: Payer: Self-pay | Admitting: Oncology

## 2024-07-01 NOTE — Telephone Encounter (Signed)
 This patient was last seen looks like in 2017. She called today to say that she has found another knot in the same breast that was removed previously. She said she followed up with her PCP and they told her to get in touch with you as soon as she could. She is asking for a call back at [phone].  Her PCP Lauraine Minerva in [place] is talking about doing an ultrasound. Her office number is [phone].  Patient says she wonders if this is why she has been feeling bad for a couple of months. She is very sad and crying and can hardly get herself together to talk. She said she already had a vacation planned and hopes to be on that vacation 9/1-9/4 .   I told her someone would call her Monday and let her know what needs to happen next to get her taken care of.   Please advise how I can help move forward with getting this patient taken care of.

## 2024-07-04 ENCOUNTER — Encounter: Payer: Self-pay | Admitting: *Deleted

## 2024-07-04 NOTE — Progress Notes (Signed)
 Spoke with Elizabeth Zamora.   She found a lump on her mastectomy site about a 1 1/2 weeks ago.   Her PCP is ordering the ultrasound.  She will notify me when it is scheduled.   I explained that Dr. Jacobo will need the ultrasound and possible biopsy before seeing her.

## 2024-07-20 ENCOUNTER — Encounter: Payer: Self-pay | Admitting: *Deleted

## 2024-07-20 NOTE — Progress Notes (Signed)
 Called Elizabeth Zamora to see if ultrasound had been scheduled for the lump she had found.   When we last spoke she said her PCP was going to order that.   She is going to call her PCP again and check on that.

## 2024-10-04 ENCOUNTER — Encounter: Payer: Self-pay | Admitting: Oncology

## 2024-10-17 ENCOUNTER — Encounter: Payer: Self-pay | Admitting: Oncology

## 2024-10-17 ENCOUNTER — Inpatient Hospital Stay: Attending: Oncology | Admitting: Oncology

## 2024-10-17 ENCOUNTER — Encounter: Payer: Self-pay | Admitting: *Deleted

## 2024-10-17 ENCOUNTER — Inpatient Hospital Stay

## 2024-10-17 VITALS — BP 136/66 | HR 66 | Temp 96.5°F | Resp 18 | Ht 67.0 in | Wt 117.0 lb

## 2024-10-17 DIAGNOSIS — F17210 Nicotine dependence, cigarettes, uncomplicated: Secondary | ICD-10-CM | POA: Diagnosis not present

## 2024-10-17 DIAGNOSIS — Z9011 Acquired absence of right breast and nipple: Secondary | ICD-10-CM | POA: Insufficient documentation

## 2024-10-17 DIAGNOSIS — Z853 Personal history of malignant neoplasm of breast: Secondary | ICD-10-CM | POA: Diagnosis present

## 2024-10-17 DIAGNOSIS — Z803 Family history of malignant neoplasm of breast: Secondary | ICD-10-CM | POA: Insufficient documentation

## 2024-10-17 DIAGNOSIS — Z08 Encounter for follow-up examination after completed treatment for malignant neoplasm: Secondary | ICD-10-CM | POA: Insufficient documentation

## 2024-10-17 NOTE — Progress Notes (Unsigned)
 [facility]  Telephone:(336) [phone] Fax:(336) [phone]  ID: Elizabeth Zamora OB: 1961-02-24  MR#: [id number]  RDW#:[id number]  Patient Care Team: Tobie Domino, MD as PCP - General (Family Medicine) Jacobo Evalene PARAS, MD as Consulting Physician (Oncology) Georgina Shasta POUR, RN as Oncology Nurse Navigator  CHIEF COMPLAINT: History of breast cancer.  INTERVAL HISTORY: Patient is a 63 year old female with a distant history of breast cancer who noticed a new nodule at the site of her previous mastectomy.  She is highly anxious, but otherwise feels well.  She has no neurologic complaints.  She denies any recent fevers or illnesses.  She has a good appetite and denies weight loss.  She has no chest pain, shortness of breath, cough, or hemoptysis.  She denies any nausea, vomiting, constipation, or diarrhea.  She has no urinary complaints.  Patient offers no further specific complaints today.   REVIEW OF SYSTEMS:   Review of Systems  Constitutional: Negative.  Negative for fever, malaise/fatigue and weight loss.  Respiratory: Negative.  Negative for cough, hemoptysis and shortness of breath.   Cardiovascular: Negative.  Negative for chest pain and leg swelling.  Gastrointestinal: Negative.  Negative for abdominal pain.  Genitourinary: Negative.  Negative for dysuria.  Musculoskeletal: Negative.  Negative for back pain.  Skin: Negative.  Negative for rash.  Neurological: Negative.  Negative for dizziness, focal weakness, weakness and headaches.  Psychiatric/Behavioral:  The patient is nervous/anxious.     As per HPI. Otherwise, a complete review of systems is negative.  PAST MEDICAL HISTORY: Past Medical History:  Diagnosis Date   Breast cancer ([facility]) 2009   RT MASTECTOMY   Cancer ([facility]) 2009   BREAST CA    PAST SURGICAL HISTORY: Past Surgical History:  Procedure Laterality Date   MASTECTOMY Right 2009   BREAST CA    FAMILY HISTORY: Family History  Problem  Relation Age of Onset   Breast cancer Paternal Grandmother     ADVANCED DIRECTIVES (Y/N):  N  HEALTH MAINTENANCE: Social History   Tobacco Use   Smoking status: Every Day    Current packs/day: 0.25    Types: Cigarettes   Smokeless tobacco: Never  Substance Use Topics   Alcohol use: Yes   Drug use: Never     Colonoscopy:  PAP:  Bone density:  Lipid panel:  No Known Allergies  Current Outpatient Medications  Medication Sig Dispense Refill   acyclovir ointment (ZOVIRAX) 5 % APPLY TO THE AFFECTED AREA(S) BY TOPICAL ROUTE EVERY 3 TIMES PER DAY FOR 7 DAYS.     Albuterol Sulfate, sensor, (PROAIR DIGIHALER) 108 (90 Base) MCG/ACT AEPB Inhale 2 puffs every 4 hours by inhalation route.     ALPRAZolam (XANAX) 1 MG tablet Take 1 mg by mouth 3 (three) times daily as needed for anxiety.     amLODipine (NORVASC) 5 MG tablet TAKE 1 TABLET EVERY DAY BY ORAL ROUTE.     chlorhexidine (PERIDEX) 0.12 % solution SWISH FOR 30 SECONDS WITH ONE CAPFUL THEN SPIT OUT THREE TIMES A DAY FOR TWO WEEKS     COMBIVENT RESPIMAT 20-100 MCG/ACT AERS respimat   1   fexofenadine (ALLEGRA ODT) 30 MG disintegrating tablet Take 30 mg by mouth daily.     Gabapentin, Once-Daily, 600 MG TABS Take 1 tablet 3 times a day by oral route.     methocarbamol (ROBAXIN) 750 MG tablet Take 750 mg by mouth 3 (three) times daily.     traZODone (DESYREL) 100 MG tablet TAKE 1-2 TABLETS  BY MOUTH AT BEDTIME FOR INSOMNIA     cyclobenzaprine (FLEXERIL) 10 MG tablet Take 10 mg by mouth 2 (two) times daily as needed for muscle spasms. (Patient not taking: Reported on 10/17/2024)     gabapentin (NEURONTIN) 300 MG capsule Take 300 mg by mouth 3 (three) times daily. (Patient not taking: Reported on 10/17/2024)     omeprazole (PRILOSEC) 20 MG capsule Take by mouth. (Patient not taking: Reported on 10/17/2024)     No current facility-administered medications for this visit.    OBJECTIVE: Vitals:   10/17/24 1137  BP: 136/66  Pulse: 66   Resp: 18  Temp: (!) 96.5 F (35.8 C)  SpO2: 97%     Body mass index is 18.32 kg/m.    ECOG FS:0 - Asymptomatic  General: Well-developed, well-nourished, no acute distress. Eyes: Pink conjunctiva, anicteric sclera. HEENT: Normocephalic, moist mucous membranes. Chest wall: Right mastectomy.  Minimally palpable subcutaneous lesion. Lungs: No audible wheezing or coughing. Heart: Regular rate and rhythm. Abdomen: Soft, nontender, no obvious distention. Musculoskeletal: No edema, cyanosis, or clubbing. Neuro: Alert, answering all questions appropriately. Cranial nerves grossly intact. Skin: No rashes or petechiae noted. Psych: Normal affect. Lymphatics: No cervical, calvicular, axillary or inguinal LAD.   LAB RESULTS:  Lab Results  Component Value Date   NA 137 07/07/2013   K 3.3 (L) 07/07/2013   CL 106 07/07/2013   CO2 24 07/07/2013   GLUCOSE 107 (H) 07/07/2013   BUN 15 07/07/2013   CREATININE 0.78 07/07/2013   CALCIUM 8.8 07/07/2013   PROT 6.8 03/11/2013   ALBUMIN 4.0 03/11/2013   AST 20 03/11/2013   ALT 19 03/11/2013   ALKPHOS 102 03/11/2013   BILITOT 0.7 03/11/2013   GFRNONAA >60 07/07/2013   GFRAA >60 07/07/2013    Lab Results  Component Value Date   WBC 6.9 07/07/2013   HGB 14.4 07/07/2013   HCT 41.9 07/07/2013   MCV 100 07/07/2013   PLT 160 07/07/2013     STUDIES: No results found.  ASSESSMENT: History of breast cancer.  PLAN:    History of breast cancer: Patient underwent mastectomy and chemotherapy in 2009.  She was subsequently given an aromatase inhibitor, but did not complete 5 years of treatment.  Will get ultrasound and biopsy of chest wall lesion.  If negative, no further follow-up is necessary.  If positive for malignancy, patient will return to clinic to discuss treatment options.  I spent a total of 60 minutes reviewing chart data, face-to-face evaluation with the patient, counseling and coordination of care as detailed above.   Patient  expressed understanding and was in agreement with this plan. She also understands that She can call clinic at any time with any questions, concerns, or complaints.    Cancer Staging  No matching staging information was found for the patient.   Evalene JINNY Reusing, MD   10/17/2024 4:02 PM

## 2024-10-17 NOTE — Progress Notes (Signed)
 Accompanied patient  to initial medical oncology appointment.   Plan for ultrasound biopsy.   Will await for results to determine if follow up appt. Needed with Dr. Jacobo.  She will go over to [facility] center to sign a release form so they can obtain her prior images.  After those are obtained they will get the biopsy scheduled.

## 2024-10-19 ENCOUNTER — Inpatient Hospital Stay
Admission: RE | Admit: 2024-10-19 | Discharge: 2024-10-19 | Disposition: A | Discharge status: Home / Self Care | Payer: Self-pay | Source: Ambulatory Visit | Attending: Family Medicine | Admitting: Family Medicine

## 2024-10-19 ENCOUNTER — Other Ambulatory Visit: Payer: Self-pay | Admitting: *Deleted

## 2024-10-19 DIAGNOSIS — Z1231 Encounter for screening mammogram for malignant neoplasm of breast: Secondary | ICD-10-CM

## 2024-10-24 ENCOUNTER — Other Ambulatory Visit: Payer: Self-pay | Admitting: Oncology

## 2024-10-24 DIAGNOSIS — R928 Other abnormal and inconclusive findings on diagnostic imaging of breast: Secondary | ICD-10-CM

## 2024-10-31 ENCOUNTER — Other Ambulatory Visit: Payer: Self-pay | Admitting: Oncology

## 2024-10-31 DIAGNOSIS — R928 Other abnormal and inconclusive findings on diagnostic imaging of breast: Secondary | ICD-10-CM

## 2024-11-01 ENCOUNTER — Encounter

## 2024-11-01 ENCOUNTER — Other Ambulatory Visit

## 2024-11-15 ENCOUNTER — Other Ambulatory Visit

## 2024-11-15 ENCOUNTER — Encounter

## 2024-11-16 ENCOUNTER — Inpatient Hospital Stay: Attending: Oncology | Admitting: Hospice and Palliative Medicine

## 2024-11-16 ENCOUNTER — Other Ambulatory Visit

## 2024-11-16 DIAGNOSIS — Z853 Personal history of malignant neoplasm of breast: Secondary | ICD-10-CM

## 2024-11-16 NOTE — Progress Notes (Signed)
 Multidisciplinary Oncology Council Documentation  AMRY CATHY was presented by our [facility] on 11/16/2024, which included representatives from:  Palliative Care Dietitian  Physical/Occupational Therapist Nurse Navigator Genetics Social work Survivorship RN Financial Navigator Research RN   Kisha currently presents with history of h/o breast cancer  We reviewed previous medical and familial history, history of present illness, and recent lab results along with all available histopathologic and imaging studies. The MOC considered available treatment options and made the following recommendations/referrals:  None currently  The MOC is a meeting of clinicians from various specialty areas who evaluate and discuss patients for whom a multidisciplinary approach is being considered. Final determinations in the plan of care are those of the provider(s).   Today's extended care, comprehensive team conference, Jacquiline was not present for the discussion and was not examined.

## 2024-11-23 ENCOUNTER — Ambulatory Visit
Admission: RE | Admit: 2024-11-23 | Discharge: 2024-11-23 | Disposition: A | Discharge status: Home / Self Care | Source: Ambulatory Visit | Attending: Oncology | Admitting: Oncology

## 2024-11-23 ENCOUNTER — Other Ambulatory Visit: Payer: Self-pay | Admitting: Oncology

## 2024-11-23 DIAGNOSIS — R928 Other abnormal and inconclusive findings on diagnostic imaging of breast: Secondary | ICD-10-CM | POA: Insufficient documentation

## 2024-11-24 ENCOUNTER — Other Ambulatory Visit: Payer: Self-pay | Admitting: Oncology

## 2024-11-24 DIAGNOSIS — R928 Other abnormal and inconclusive findings on diagnostic imaging of breast: Secondary | ICD-10-CM

## 2025-05-24 ENCOUNTER — Other Ambulatory Visit
